# Patient Record
Sex: Female | Born: 2005 | Race: White | Hispanic: No | Marital: Single | Smoking: Never smoker
Health system: Southern US, Community
[De-identification: ages and names within clinical notes are randomized; demographics above are authoritative.]

## PROBLEM LIST (undated history)

## (undated) DIAGNOSIS — D1800 Hemangioma unspecified site: Secondary | ICD-10-CM

## (undated) DIAGNOSIS — J309 Allergic rhinitis, unspecified: Secondary | ICD-10-CM

## (undated) HISTORY — PX: TONSILLECTOMY: SUR1361

---
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
## 2024-02-09 ENCOUNTER — Encounter: Payer: Self-pay | Admitting: Emergency Medicine

## 2024-02-09 ENCOUNTER — Ambulatory Visit: Admission: EM | Admit: 2024-02-09 | Discharge: 2024-02-09 | Disposition: A | Discharge status: Home / Self Care

## 2024-02-09 DIAGNOSIS — R110 Nausea: Secondary | ICD-10-CM

## 2024-02-09 DIAGNOSIS — R591 Generalized enlarged lymph nodes: Secondary | ICD-10-CM

## 2024-02-09 DIAGNOSIS — R112 Nausea with vomiting, unspecified: Secondary | ICD-10-CM | POA: Diagnosis present

## 2024-02-09 DIAGNOSIS — B2790 Infectious mononucleosis, unspecified without complication: Secondary | ICD-10-CM | POA: Diagnosis not present

## 2024-02-09 DIAGNOSIS — R5383 Other fatigue: Secondary | ICD-10-CM | POA: Insufficient documentation

## 2024-02-09 DIAGNOSIS — R590 Localized enlarged lymph nodes: Secondary | ICD-10-CM | POA: Diagnosis not present

## 2024-02-09 LAB — CBC WITH DIFFERENTIAL/PLATELET
Abs Granulocyte: 1.9 K/uL (ref 1.5–6.5)
Abs Immature Granulocytes: 0.02 K/uL (ref 0.00–0.07)
Basophils Absolute: 0 K/uL (ref 0.0–0.1)
Basophils Relative: 1 %
Eosinophils Absolute: 0 K/uL (ref 0.0–0.5)
Eosinophils Relative: 0 %
HCT: 38.1 % (ref 36.0–46.0)
Hemoglobin: 13.6 g/dL (ref 12.0–15.0)
Immature Granulocytes: 1 %
Lymphocytes Relative: 38 %
Lymphs Abs: 1.4 K/uL (ref 0.7–4.0)
MCH: 30.7 pg (ref 26.0–34.0)
MCHC: 35.7 g/dL (ref 30.0–36.0)
MCV: 86 fL (ref 80.0–100.0)
Monocytes Absolute: 0.3 K/uL (ref 0.1–1.0)
Monocytes Relative: 8 %
Neutro Abs: 1.9 K/uL (ref 1.7–7.7)
Neutrophils Relative %: 52 %
Platelets: 165 K/uL (ref 150–400)
RBC: 4.43 MIL/uL (ref 3.87–5.11)
RDW: 13.9 % (ref 11.5–15.5)
Smear Review: NORMAL
WBC: 3.6 K/uL — ABNORMAL LOW (ref 4.0–10.5)
nRBC: 0 % (ref 0.0–0.2)

## 2024-02-09 LAB — MONONUCLEOSIS SCREEN: Mono Screen: POSITIVE — AB

## 2024-02-09 LAB — GROUP A STREP BY PCR: Group A Strep by PCR: NOT DETECTED

## 2024-02-09 LAB — SARS CORONAVIRUS 2 BY RT PCR: SARS Coronavirus 2 by RT PCR: NEGATIVE

## 2024-02-09 MED ORDER — ONDANSETRON 4 MG PO TBDP: 4.0000 mg | ORAL_TABLET | Freq: Three times a day (TID) | ORAL | 0 refills | Status: AC | PRN

## 2024-02-09 MED ORDER — IBUPROFEN 600 MG PO TABS: 600.0000 mg | ORAL_TABLET | Freq: Four times a day (QID) | ORAL | 0 refills | Status: AC | PRN

## 2024-02-09 NOTE — ED Provider Notes (Signed)
 [facility]    CSN: [id number] Arrival date & time: 02/09/24  1537      History   Chief Complaint Chief Complaint  Patient presents with   Nausea   Headache   Cyst    HPI Michele Nguyen is a 18 y.o. female presenting for 4-5 day history of fatigue, headaches, nausea/vomiting, post nasal drainage, congestion, and neck pain. No fever, sore throat, ear pain, chest pain, shortness of breath, abdominal pain, or diarrhea. Patient reports falling onto the right side of her neck while tumbling 2 weeks ago. She is unsure it that is related to current symptoms. She does have full range of motion of her neck and no pain with movement of her neck.  Patient has taken Excedrin and Motrin .  HPI  Past Medical History:  Diagnosis Date   Allergic rhinitis    Hemangioma     There are no active problems to display for this patient.   Past Surgical History:  Procedure Laterality Date   TONSILLECTOMY      OB History   No obstetric history on file.      Home Medications    Prior to Admission medications   Medication Sig Start Date End Date Taking? Authorizing Provider  EPINEPHrine 0.3 mg/0.3 mL IJ SOAJ injection Inject into the muscle as needed. 08/25/23  Yes [provider]  fluticasone (FLONASE) 50 MCG/ACT nasal spray Place 2 sprays into both nostrils daily. 01/29/24  Yes [provider]  ibuprofen  (ADVIL ) 600 MG tablet Take 1 tablet (600 mg total) by mouth every 6 (six) hours as needed. 02/09/24  Yes Arvis Huxley B, PA-C  NIKKI 3-0.02 MG tablet Take 1 tablet by mouth daily. 05/27/23  Yes [provider]  ondansetron  (ZOFRAN -ODT) 4 MG disintegrating tablet Take 1 tablet (4 mg total) by mouth every 8 (eight) hours as needed. 02/09/24  Yes Arvis Huxley B, PA-C  cetirizine (ZYRTEC) 5 MG tablet Take 5 mg by mouth daily.    [provider]  triamcinolone  ointment (KENALOG ) 0.5 % Apply 1 Application topically 2 (two) times daily. 06/05/23    Arvis Huxley NOVAK, PA-C    Family History No family history on file.  Social History Social History   Tobacco Use   Smoking status: Never   Smokeless tobacco: Never  Vaping Use   Vaping status: Never Used  Substance Use Topics   Alcohol use: No    Alcohol/week: 0.0 standard drinks of alcohol   Drug use: No     Allergies   Patient has no known allergies.   Review of Systems Review of Systems  Constitutional:  Positive for appetite change and fatigue. Negative for fever.  HENT:  Positive for congestion, postnasal drip and rhinorrhea. Negative for ear pain and sore throat.   Respiratory:  Negative for shortness of breath.   Cardiovascular:  Negative for chest pain.  Gastrointestinal:  Positive for nausea. Negative for abdominal pain and vomiting.  Musculoskeletal:  Positive for neck pain. Negative for neck stiffness.  Neurological:  Positive for headaches.  Hematological:  Positive for adenopathy.     Physical Exam Triage Vital Signs ED Triage Vitals  Encounter Vitals Group     BP      Girls Systolic BP Percentile      Girls Diastolic BP Percentile      Boys Systolic BP Percentile      Boys Diastolic BP Percentile      Pulse      Resp  Temp      Temp src      SpO2      Weight      Height      Head Circumference      Peak Flow      Pain Score      Pain Loc      Pain Education      Exclude from Growth Chart    No data found.  Updated Vital Signs BP 122/78 (BP Location: Right Arm)   Pulse 90   Temp 99.2 F (37.3 C) (Oral)   Resp 18   Wt 149 lb (67.6 kg)   SpO2 98%   BMI 26.39 kg/m      Physical Exam Vitals and nursing note reviewed.  Constitutional:      General: She is not in acute distress.    Appearance: Normal appearance. She is not ill-appearing or toxic-appearing.  HENT:     Head: Normocephalic and atraumatic.     Nose: Congestion present.     Mouth/Throat:     Mouth: Mucous membranes are moist.     Pharynx: Oropharynx is clear.   Eyes:     General: No scleral icterus.       Right eye: No discharge.        Left eye: No discharge.     Conjunctiva/sclera: Conjunctivae normal.  Cardiovascular:     Rate and Rhythm: Normal rate and regular rhythm.     Heart sounds: Normal heart sounds.  Pulmonary:     Effort: Pulmonary effort is normal. No respiratory distress.     Breath sounds: Normal breath sounds.  Abdominal:     Palpations: There is no hepatomegaly or splenomegaly.     Tenderness: There is no abdominal tenderness.  Musculoskeletal:     Cervical back: Neck supple.  Lymphadenopathy:     Cervical: Cervical adenopathy (bilateral tender anterior cervical lymphadenopathy, left posterior cervical lymphadenopathy) present.  Skin:    General: Skin is dry.  Neurological:     General: No focal deficit present.     Mental Status: She is alert. Mental status is at baseline.     Motor: No weakness.     Gait: Gait normal.  Psychiatric:        Mood and Affect: Mood normal.        Behavior: Behavior normal.      UC Treatments / Results  Labs (all labs ordered are listed, but only abnormal results are displayed) Labs Reviewed  MONONUCLEOSIS SCREEN - Abnormal; Notable for the following components:      Result Value   Mono Screen POSITIVE (*)    All other components within normal limits  CBC WITH DIFFERENTIAL/PLATELET - Abnormal; Notable for the following components:   WBC 3.6 (*)    All other components within normal limits  SARS CORONAVIRUS 2 BY RT PCR  GROUP A STREP BY PCR    EKG   Radiology No results found.  Procedures Procedures (including critical care time)  Medications Ordered in UC Medications - No data to display  Initial Impression / Assessment and Plan / UC Course  I have reviewed the triage vital signs and the nursing notes.  Pertinent labs & imaging results that were available during my care of the patient were reviewed by me and considered in my medical decision making (see chart for  details).   18 y/o female presents for fatigue, headaches, nausea/vomiting, and neck pain/swelling x 4-5 days.  Vitals are all normal  and stable. She is well appearing overall. She does have bilateral tender anterior and left posterior cervical lymphadenopathy and nasal congestion. Full ROM of neck. Chest clear. Heart RRR.  Strep, mono, COVID, and CBC obtained.  +mono. Other testing negative/without significant abnormality.   Discussed positive monotest and reviewed that care is mostly supportive.  I sent Zofran  to pharmacy as well as ibuprofen .  Encourage plenty of rest and fluids.  Patient does not play any contact sports or do any heavy lifting.  We discussed the risk of spleen rupture.  Explained that symptoms could last for a few weeks.  Discussed how illness is transmitted.  Reviewed return and ER precautions.  A school note was given.  Acute illness with systemic symptoms.   Final Clinical Impressions(s) / UC Diagnoses   Final diagnoses:  Infectious mononucleosis without complication, infectious mononucleosis due to unspecified organism  Lymphadenopathy of head and neck  Other fatigue     Discharge Instructions      -The mono test was positive. -Supportive care is advised. -I sent nausea medicine. Continue ibuprofen  (with food) and Tylenol. -Increase rest and fluids -Symptoms can last for weeks -It is advised to refrain from high contact sports for 8 weeks from symptom onset. There is a risk of spleen rupture.     ED Prescriptions     Medication Sig Dispense Auth. Provider   ondansetron  (ZOFRAN -ODT) 4 MG disintegrating tablet Take 1 tablet (4 mg total) by mouth every 8 (eight) hours as needed. 15 tablet Arvis Huxley B, PA-C   ibuprofen  (ADVIL ) 600 MG tablet Take 1 tablet (600 mg total) by mouth every 6 (six) hours as needed. 30 tablet Hugh Garrow B, PA-C      PDMP not reviewed this encounter.   Arvis Huxley NOVAK, PA-C 02/09/24 [phone]

## 2024-02-09 NOTE — ED Triage Notes (Signed)
 Pt presents with nausea, vomiting and headache x 5 days. She has vomited a few times in the past 5 days. Appx 2 weeks ago she was tumbling and landed on the right side of her neck. She noticed last night that she had a knot on the left side of her neck. She had a sharp pain in her throat when she swallowed today.   She took an Excedrin this morning that has helped with the pain

## 2024-02-09 NOTE — Discharge Instructions (Signed)
-  The mono test was positive. -Supportive care is advised. -I sent nausea medicine. Continue ibuprofen  (with food) and Tylenol. -Increase rest and fluids -Symptoms can last for weeks -It is advised to refrain from high contact sports for 8 weeks from symptom onset. There is a risk of spleen rupture.

## 2024-02-10 ENCOUNTER — Ambulatory Visit: Payer: Self-pay

## 2024-02-24 ENCOUNTER — Ambulatory Visit
Admission: EM | Admit: 2024-02-24 | Discharge: 2024-02-24 | Disposition: A | Discharge status: Home / Self Care | Attending: Emergency Medicine | Admitting: Emergency Medicine

## 2024-02-24 ENCOUNTER — Encounter: Payer: Self-pay | Admitting: Emergency Medicine

## 2024-02-24 DIAGNOSIS — H66003 Acute suppurative otitis media without spontaneous rupture of ear drum, bilateral: Secondary | ICD-10-CM | POA: Diagnosis present

## 2024-02-24 DIAGNOSIS — J029 Acute pharyngitis, unspecified: Secondary | ICD-10-CM | POA: Diagnosis present

## 2024-02-24 DIAGNOSIS — J069 Acute upper respiratory infection, unspecified: Secondary | ICD-10-CM | POA: Insufficient documentation

## 2024-02-24 LAB — GROUP A STREP BY PCR: Group A Strep by PCR: NOT DETECTED

## 2024-02-24 MED ORDER — CEFDINIR 300 MG PO CAPS: 300.0000 mg | ORAL_CAPSULE | Freq: Two times a day (BID) | ORAL | 0 refills | Status: AC

## 2024-02-24 MED ORDER — LIDOCAINE VISCOUS HCL 2 % MT SOLN: 5.0000 mL | Freq: Four times a day (QID) | OROMUCOSAL | 0 refills | Status: AC | PRN

## 2024-02-24 NOTE — ED Provider Notes (Signed)
 [facility]    CSN: [id number] Arrival date & time: 02/24/24  1321      History   Chief Complaint Chief Complaint  Patient presents with   Sore Throat    HPI Michele Nguyen is a 18 y.o. female.   HPI  19 year old female with past medical history significant for allergic rhinitis presents for evaluation of 4 days worth of sore throat, intraoral rash, and popping in her right ear.  She denies any fever or cough.  She has had runny nose and nasal congestion and her discharge has turned green.  Past Medical History:  Diagnosis Date   Allergic rhinitis    Hemangioma     There are no active problems to display for this patient.   Past Surgical History:  Procedure Laterality Date   TONSILLECTOMY      OB History   No obstetric history on file.      Home Medications    Prior to Admission medications   Medication Sig Start Date End Date Taking? Authorizing Provider  cefdinir  (OMNICEF ) 300 MG capsule Take 1 capsule (300 mg total) by mouth 2 (two) times daily for 7 days. 02/24/24 03/02/24 Yes Bernardino Ditch, NP  magic mouthwash (lidocaine , diphenhydrAMINE, alum & mag hydroxide) suspension Swish and spit 5 mLs 4 (four) times daily as needed for mouth pain. 02/24/24  Yes Bernardino Ditch, NP  cetirizine (ZYRTEC) 5 MG tablet Take 5 mg by mouth daily.    [provider]  EPINEPHrine 0.3 mg/0.3 mL IJ SOAJ injection Inject into the muscle as needed. 08/25/23   [provider]  fluticasone (FLONASE) 50 MCG/ACT nasal spray Place 2 sprays into both nostrils daily. 01/29/24   [provider]  ibuprofen  (ADVIL ) 600 MG tablet Take 1 tablet (600 mg total) by mouth every 6 (six) hours as needed. 02/09/24   Arvis Huxley B, PA-C  NIKKI 3-0.02 MG tablet Take 1 tablet by mouth daily. 05/27/23   [provider]  ondansetron  (ZOFRAN -ODT) 4 MG disintegrating tablet Take 1 tablet (4 mg total) by mouth every 8 (eight) hours as needed. 02/09/24   Arvis Huxley B,  PA-C  triamcinolone  ointment (KENALOG ) 0.5 % Apply 1 Application topically 2 (two) times daily. 06/05/23   Arvis Huxley NOVAK, PA-C    Family History No family history on file.  Social History Social History   Tobacco Use   Smoking status: Never   Smokeless tobacco: Never  Vaping Use   Vaping status: Never Used  Substance Use Topics   Alcohol use: No    Alcohol/week: 0.0 standard drinks of alcohol   Drug use: No     Allergies   Other   Review of Systems Review of Systems  Constitutional:  Negative for fever.  HENT:  Positive for congestion, ear pain, rhinorrhea and sore throat.   Respiratory:  Negative for cough.   Skin:  Negative for rash.     Physical Exam Triage Vital Signs ED Triage Vitals  Encounter Vitals Group     BP 02/24/24 1457 118/78     Girls Systolic BP Percentile --      Girls Diastolic BP Percentile --      Boys Systolic BP Percentile --      Boys Diastolic BP Percentile --      Pulse Rate 02/24/24 1457 (!) 105     Resp 02/24/24 1457 18     Temp 02/24/24 1457 98.1 F (36.7 C)     Temp Source 02/24/24 1457  Oral     SpO2 02/24/24 1457 100 %     Weight 02/24/24 1456 149 lb (67.6 kg)     Height --      Head Circumference --      Peak Flow --      Pain Score 02/24/24 1456 5     Pain Loc --      Pain Education --      Exclude from Growth Chart --    No data found.  Updated Vital Signs BP 118/78 (BP Location: Left Arm)   Pulse (!) 105   Temp 98.1 F (36.7 C) (Oral)   Resp 18   Wt 149 lb (67.6 kg)   SpO2 100%   BMI 26.39 kg/m   Visual Acuity Right Eye Distance:   Left Eye Distance:   Bilateral Distance:    Right Eye Near:   Left Eye Near:    Bilateral Near:     Physical Exam Vitals and nursing note reviewed.  Constitutional:      Appearance: Normal appearance. She is not ill-appearing.  HENT:     Head: Normocephalic and atraumatic.     Right Ear: Ear canal and external ear normal. There is no impacted cerumen.     Left Ear:  Ear canal and external ear normal. There is no impacted cerumen.     Ears:     Comments: Bilateral tympanic membranes are erythematous and injected.  Both EACs are clear.    Nose: Congestion and rhinorrhea present.     Comments: Nasal mucosa is erythematous and edematous with clear discharge in both nares.    Mouth/Throat:     Mouth: Mucous membranes are moist.     Pharynx: Oropharyngeal exudate and posterior oropharyngeal erythema present.     Comments: Petechial rash on the hard palate.  Uvula is erythematous, edematous, and covered in white exudate. Skin:    General: Skin is warm and dry.     Capillary Refill: Capillary refill takes less than 2 seconds.     Findings: No rash.  Neurological:     General: No focal deficit present.     Mental Status: She is alert and oriented to person, place, and time.      UC Treatments / Results  Labs (all labs ordered are listed, but only abnormal results are displayed) Labs Reviewed  GROUP A STREP BY PCR    EKG   Radiology No results found.  Procedures Procedures (including critical care time)  Medications Ordered in UC Medications - No data to display  Initial Impression / Assessment and Plan / UC Course  I have reviewed the triage vital signs and the nursing notes.  Pertinent labs & imaging results that were available during my care of the patient were reviewed by me and considered in my medical decision making (see chart for details).   Patient is a pleasant, nontoxic-appearing 18 year old female presenting for evaluation of upper respiratory symptoms as outlined in HPI above.  The patient was evaluated in this urgent care on 02/09/2024 and diagnosed with infectious mononucleosis.  At that time she was also checked for strep and COVID which were both negative.  She reports that her sore throat has started to feel better but then worsened 4 days ago and she also developed a rash inside of her mouth.  She is complaining of significant  pain inside of her mouth and having difficulty eating solid foods.  She has not had any fever in the last week.  She is continue to experience runny nose, nasal congestion, and reports that her discharge is green.  No cough.  On exam patient has erythematous and injected tympanic membranes bilaterally but both EACs are clear.  Her nasal mucosa is edematous and erythematous though her discharge is clear.  Her oropharyngeal exam reveals petechial rash on the hard palate as well as erythema and edema of the uvula with white exudate.  Her tonsillar pillars are surgically absent.  The patient is also requesting Magic mouthwash to assist her with eating.  I am happy to prescribe that for her.  I will repeat the patient's strep PCR as well.  Strep PCR is negative.  I will discharge patient home with a diagnosis of pharyngitis and bilateral otitis media.  I will discharge her home on cefdinir  300 mg twice daily for 7 days.  I will also prescribe Magic mouthwash that she can use 4 times a day as needed for sore throat pain.  Salt water gargles and over-the-counter analgesia such as Tylenol and/or ibuprofen  as well for pain and any fever that may develop.   Final Clinical Impressions(s) / UC Diagnoses   Final diagnoses:  Acute URI  Pharyngitis, unspecified etiology  Non-recurrent acute suppurative otitis media of both ears without spontaneous rupture of tympanic membranes     Discharge Instructions      Take the cefdinir  twice daily for 7 days with food for treatment of your ear infection.  Take an over-the-counter probiotic 1 hour after each dose of antibiotic to prevent diarrhea.  Use over-the-counter Tylenol and ibuprofen  as needed for pain or fever.  Place a hot water bottle, or heating pad, underneath your pillowcase at night to help dilate up your ear and aid in pain relief as well as resolution of the infection.  You may swish, gargle, and spit 1 teaspoon of Magic mouthwash 15 minutes  before meals and at bedtime to help with sore throat pain.  You may also gargle with warm salt water as often as you like.  Mix 1 tablespoon of table salt in 8 ounces of warm water, gargle and spit.  Chloraseptic and Sucrets lozenges can also be effective in managing sore throat pain.  No more than 1 lozenge every 2 hours as the menthol may give you diarrhea.  Return for reevaluation for any new or worsening symptoms.      ED Prescriptions     Medication Sig Dispense Auth. Provider   cefdinir  (OMNICEF ) 300 MG capsule Take 1 capsule (300 mg total) by mouth 2 (two) times daily for 7 days. 14 capsule Bernardino Ditch, NP   magic mouthwash (lidocaine , diphenhydrAMINE, alum & mag hydroxide) suspension Swish and spit 5 mLs 4 (four) times daily as needed for mouth pain. 360 mL Bernardino Ditch, NP      PDMP not reviewed this encounter.   Bernardino Ditch, NP 02/24/24 1624

## 2024-02-24 NOTE — ED Triage Notes (Signed)
 Pt presents with a sore throat x 4 days. Pt was diagnosed with Mono on 02/09/24. She also has a rash around her mouth and popping in her right ear.

## 2024-02-24 NOTE — Discharge Instructions (Addendum)
 Take the cefdinir  twice daily for 7 days with food for treatment of your ear infection.  Take an over-the-counter probiotic 1 hour after each dose of antibiotic to prevent diarrhea.  Use over-the-counter Tylenol and ibuprofen  as needed for pain or fever.  Place a hot water bottle, or heating pad, underneath your pillowcase at night to help dilate up your ear and aid in pain relief as well as resolution of the infection.  You may swish, gargle, and spit 1 teaspoon of Magic mouthwash 15 minutes before meals and at bedtime to help with sore throat pain.  You may also gargle with warm salt water as often as you like.  Mix 1 tablespoon of table salt in 8 ounces of warm water, gargle and spit.  Chloraseptic and Sucrets lozenges can also be effective in managing sore throat pain.  No more than 1 lozenge every 2 hours as the menthol may give you diarrhea.  Return for reevaluation for any new or worsening symptoms.

## 2024-06-25 ENCOUNTER — Ambulatory Visit
Admission: EM | Admit: 2024-06-25 | Discharge: 2024-06-25 | Disposition: A | Discharge status: Home / Self Care | Attending: Family Medicine | Admitting: Family Medicine

## 2024-06-25 DIAGNOSIS — L089 Local infection of the skin and subcutaneous tissue, unspecified: Secondary | ICD-10-CM

## 2024-06-25 DIAGNOSIS — B9689 Other specified bacterial agents as the cause of diseases classified elsewhere: Secondary | ICD-10-CM | POA: Diagnosis not present

## 2024-06-25 DIAGNOSIS — J029 Acute pharyngitis, unspecified: Secondary | ICD-10-CM | POA: Diagnosis not present

## 2024-06-25 LAB — POCT RAPID STREP A (OFFICE): Rapid Strep A Screen: NEGATIVE

## 2024-06-25 MED ORDER — DOXYCYCLINE HYCLATE 100 MG PO CAPS: 100.0000 mg | ORAL_CAPSULE | Freq: Two times a day (BID) | ORAL | 0 refills | Status: AC

## 2024-06-25 NOTE — ED Provider Notes (Signed)
 " [facility]   [id number] 06/25/24 Arrival Time: 1505  ASSESSMENT & PLAN:  1. Sore throat   2. Localized bacterial skin infection   Rapid strep negative; suspect virus.  Incision and Drainage Procedure Note  Anesthesia: not needed  Procedure Details  The procedure, risks and complications have been discussed in detail (including, but not limited to pain and bleeding) with the patient.  The skin induration was prepped and draped in the usual fashion. After adequate local anesthesia, I&D with a #11 blade was performed on the two small areas superior to umbilicus; scant purulent discharge expressed; no complications.  She elects against deeper exploration. Begin: Meds ordered this encounter  Medications   doxycycline  (VIBRAMYCIN ) 100 MG capsule    Sig: Take 1 capsule (100 mg total) by mouth 2 (two) times daily.    Dispense:  14 capsule    Refill:  0     Discharge Instructions      You may use over the counter ibuprofen  or acetaminophen as needed.  For a sore throat, over the counter products such as [employer] Peroxyl Mouth Sore Rinse or Chloraseptic Sore Throat Spray may provide some temporary relief. Your rapid strep test was negative today.        Reviewed expectations re: course of current medical issues. Questions answered. Outlined signs and symptoms indicating need for more acute intervention. Patient verbalized understanding. After Visit Summary given.   SUBJECTIVE:  Michele Nguyen is a 19 y.o. female who presents with a possible infection of her umbilicus; recent ring piercing; has drained some yellow stuff; few days. Also with ST; today. Denies fever.    OBJECTIVE:  Vitals:   06/25/24 1525 06/25/24 1526  BP:  121/80  Pulse:  73  Resp:  18  Temp:  98.4 F (36.9 C)  TempSrc:  Oral  SpO2:  100%  Weight: 67.6 kg      General appearance: alert; no distress Throat: erythematous Skin: few mm induration x 2 just superior of umbilicus;  mild erythemat; tender to touch; no active drainage or bleeding Psychological: alert and cooperative; normal mood and affect  Allergies[1]  Past Medical History:  Diagnosis Date   Allergic rhinitis    Hemangioma    Social History   Socioeconomic History   Marital status: Single    Spouse name: Not on file   Number of children: Not on file   Years of education: Not on file   Highest education level: Not on file  Occupational History   Not on file  Tobacco Use   Smoking status: Never   Smokeless tobacco: Never  Vaping Use   Vaping status: Never Used  Substance and Sexual Activity   Alcohol use: No    Alcohol/week: 0.0 standard drinks of alcohol   Drug use: No   Sexual activity: Not Currently  Other Topics Concern   Not on file  Social History Narrative   Not on file   Social Drivers of Health   Tobacco Use: Low Risk (06/25/2024)   Patient History    Smoking Tobacco Use: Never    Smokeless Tobacco Use: Never    Passive Exposure: Not on file  Financial Resource Strain: Not on file  Food Insecurity: Not on file  Transportation Needs: Not on file  Physical Activity: Not on file  Stress: Not on file  Social Connections: Not on file  Depression (EYV7-0): Not on file  Alcohol Screen: Not on file  Housing: Not on file  Utilities: Not  on file  Health Literacy: Not on file   History reviewed. No pertinent family history. Past Surgical History:  Procedure Laterality Date   TONSILLECTOMY               [1]  Allergies Allergen Reactions   Other Other (See Comments)    Congestion due to Dust, cockroaches, trees, cats & dogs     Rolinda Rogue, MD 06/25/24 1600  "

## 2024-06-25 NOTE — Discharge Instructions (Addendum)
You may use over the counter ibuprofen or acetaminophen as needed.  For a sore throat, over the counter products such as [employer] Peroxyl Mouth Sore Rinse or Chloraseptic Sore Throat Spray may provide some temporary relief. Your rapid strep test was negative today.

## 2024-06-25 NOTE — ED Triage Notes (Signed)
 Patient states that she noticed her naval piercing getting infected 3 days ago. Took out the piercing but is still infected.   Sore throat x 1 week
